# Patient Record
Sex: Male | Born: 1975 | Hispanic: Yes | Smoking: Never smoker
Health system: Southern US, Community
[De-identification: ages and names within clinical notes are randomized; demographics above are authoritative.]

## PROBLEM LIST (undated history)

## (undated) HISTORY — PX: OTHER SURGICAL HISTORY: SHX169

---

## 2015-06-27 ENCOUNTER — Encounter (HOSPITAL_COMMUNITY): Payer: Self-pay | Admitting: Emergency Medicine

## 2015-06-27 ENCOUNTER — Emergency Department (HOSPITAL_COMMUNITY)
Admission: EM | Admit: 2015-06-27 | Discharge: 2015-06-28 | Disposition: A | Discharge status: Home / Self Care | Payer: Self-pay | Attending: Emergency Medicine | Admitting: Emergency Medicine

## 2015-06-27 DIAGNOSIS — M791 Myalgia, unspecified site: Secondary | ICD-10-CM

## 2015-06-27 LAB — CBC WITH DIFFERENTIAL/PLATELET
Basophils Absolute: 0 10*3/uL (ref 0.0–0.1)
Basophils Relative: 0 % (ref 0–1)
EOS PCT: 2 % (ref 0–5)
Eosinophils Absolute: 0.1 10*3/uL (ref 0.0–0.7)
HCT: 44 % (ref 39.0–52.0)
Hemoglobin: 15 g/dL (ref 13.0–17.0)
Lymphocytes Relative: 45 % (ref 12–46)
Lymphs Abs: 2.6 10*3/uL (ref 0.7–4.0)
MCH: 31.5 pg (ref 26.0–34.0)
MCHC: 34.1 g/dL (ref 30.0–36.0)
MCV: 92.4 fL (ref 78.0–100.0)
Monocytes Absolute: 0.5 10*3/uL (ref 0.1–1.0)
Monocytes Relative: 8 % (ref 3–12)
Neutro Abs: 2.7 10*3/uL (ref 1.7–7.7)
Neutrophils Relative %: 45 % (ref 43–77)
PLATELETS: 247 10*3/uL (ref 150–400)
RBC: 4.76 MIL/uL (ref 4.22–5.81)
RDW: 13.1 % (ref 11.5–15.5)
WBC: 5.9 10*3/uL (ref 4.0–10.5)

## 2015-06-27 NOTE — ED Provider Notes (Signed)
CSN: [id number]     Arrival date & time 06/27/15  2259 History  This chart was scribed for  Darius Baton, MD by Bethel Born, ED Scribe. This patient was seen in room A03C/A03C and the patient's care was started at 11:59 PM.    Chief Complaint  Patient presents with  . Muscle Pain  . Chills    The history is provided by the patient and a friend. A language interpreter was used.   Darius Barrett is a 39 y.o. male who presents to the Emergency Department complaining of increasing myalgias with onset 5 months ago. The pain worsened last week and is primarily in the extremities and torso. He describes the pain as cramping. No manual labor or weight lifting. Associated symptoms include subjective fever 2 days ago, chills, headache, back pain that is worse with lifting, difficulty ambulating, generalized weakness and mild SOB. Pt denies cough, nausea,vomiting, and bowel or bladder incontinence.  He has been evaluated by a physician and was told that he may not be getting enough oxygen in his head. No other medical history. No daily medication. Pt uses alcohol occasionally and denies tobacco use. No PCP.   Pt speaks some English but the history is primarily translated from Spanish by a friend at the bedside.   History reviewed. No pertinent past medical history. Past Surgical History  Procedure Laterality Date  . Head surgery    . Arm surgery     No family history on file. History  Substance Use Topics  . Smoking status: Never Smoker   . Smokeless tobacco: Not on file  . Alcohol Use: Yes    Review of Systems  Constitutional: Positive for chills. Negative for fever.  Respiratory: Negative.  Negative for chest tightness and shortness of breath.   Cardiovascular: Negative.  Negative for chest pain.  Gastrointestinal: Negative.  Negative for abdominal pain.  Genitourinary: Negative.  Negative for dysuria.  Musculoskeletal: Positive for myalgias and back pain.  Skin: Negative for rash.   Neurological: Positive for weakness. Negative for headaches.  All other systems reviewed and are negative.     Allergies  Review of patient's allergies indicates no known allergies.  Home Medications   Prior to Admission medications   Not on File   BP 134/84 mmHg  Pulse 66  Temp(Src) 98.3 F (36.8 C) (Oral)  Resp 14  Ht 5\' 9"  (1.753 m)  Wt 146 lb (66.225 kg)  BMI 21.55 kg/m2  SpO2 96% Physical Exam  Constitutional: He is oriented to person, place, and time. No distress.  HENT:  Head: Normocephalic and atraumatic.  Mouth/Throat: Oropharynx is clear and moist.  Eyes: Pupils are equal, round, and reactive to light.  Neck: Neck supple.  Cardiovascular: Normal rate, regular rhythm and normal heart sounds.   No murmur heard. Pulmonary/Chest: Effort normal and breath sounds normal. No respiratory distress. He has no wheezes.  Abdominal: Soft. Bowel sounds are normal. There is no tenderness. There is no rebound and no guarding.  Musculoskeletal: He exhibits no edema.  Neurological: He is alert and oriented to person, place, and time.  4+ out of 5 strength all 4 extremities including grip, hip and knee flexion and extension, dorsi and plantar flexion  Skin: Skin is warm and dry.  Psychiatric: He has a normal mood and affect.  Nursing note and vitals reviewed.   ED Course  Procedures   DIAGNOSTIC STUDIES: Oxygen Saturation is 96% on RA, normal by my interpretation.    COORDINATION OF  CARE: 12:04 AM Discussed treatment plan which includes lab work and EKG with pt at bedside and pt agreed to plan.  1:49 AM I re-evaluated the patient and provided an update on the results of his lab work.  Labs Review Labs Reviewed  COMPREHENSIVE METABOLIC PANEL - Abnormal; Notable for the following:    Glucose, Bld 105 (*)    All other components within normal limits  CBC WITH DIFFERENTIAL/PLATELET  CK    Imaging Review Dg Chest Portable 1 View  06/28/2015   CLINICAL DATA:   Dyspnea, intermittent chills.  Onset last week.  EXAM: PORTABLE CHEST - 1 VIEW  COMPARISON:  None.  FINDINGS: A single AP portable view of the chest demonstrates no focal airspace consolidation or alveolar edema. The lungs are grossly clear. There is no large effusion or pneumothorax. Cardiac and mediastinal contours appear unremarkable.  IMPRESSION: No active disease.   Electronically Signed   By: Ellery Plunk M.D.   On: 06/28/2015 00:58     EKG Interpretation   Date/Time:  Friday June 27 2015 23:49:27 EDT Ventricular Rate:  63 PR Interval:  149 QRS Duration: 103 QT Interval:  414 QTC Calculation: 424 R Axis:   73 Text Interpretation:  Sinus rhythm Confirmed by Sal Spratley  MD, Rafik Koppel  ([id number]) on 06/28/2015 1:22:02 AM            MDM   Final diagnoses:  Myalgia    Patient presents with several months of muscle cramping and generalized weakness. Also reports chills and shortness of breath. No cough. No other infectious symptoms.  Nontoxic on exam. Vital signs are reassuring. EKG is normal. Chest x-ray shows no evidence of pneumonia. Basic labwork including CBC, CMP, and CK are reassuring. No evidence of electrolyte abnormalities or rhabdomyolysis.  On exam he was nonfocal. He did have 4+ out of 5 strength but that was symmetric and in all 4 extremities. This may be related to effort. Patient was able to ambulate independently and reports that he feels better after fluids. Discussed lab work and follow-up with the patient.  Given the chronic and ongoing nature of the patient's symptoms, do not need to feel he needs further workup at this time. He does need to establish primary care and was given referral.  After history, exam, and medical workup I feel the patient has been appropriately medically screened and is safe for discharge home. Pertinent diagnoses were discussed with the patient. Patient was given return precautions.  I personally performed the services described in this  documentation, which was scribed in my presence. The recorded information has been reviewed and is accurate.      Darius Baton, MD 06/28/15 0200

## 2015-06-27 NOTE — ED Notes (Signed)
Pt. reports intermittent chills and generalized body ( muscle) aches and cramps onset last week .

## 2015-06-28 ENCOUNTER — Emergency Department (HOSPITAL_COMMUNITY): Payer: Self-pay

## 2015-06-28 LAB — COMPREHENSIVE METABOLIC PANEL
ALBUMIN: 3.7 g/dL (ref 3.5–5.0)
ALT: 18 U/L (ref 17–63)
ANION GAP: 8 (ref 5–15)
AST: 22 U/L (ref 15–41)
Alkaline Phosphatase: 80 U/L (ref 38–126)
BUN: 14 mg/dL (ref 6–20)
CHLORIDE: 108 mmol/L (ref 101–111)
CO2: 25 mmol/L (ref 22–32)
Calcium: 9.1 mg/dL (ref 8.9–10.3)
Creatinine, Ser: 1.02 mg/dL (ref 0.61–1.24)
GFR calc non Af Amer: >60 mL/min (ref >60)
Glucose, Bld: 105 mg/dL — ABNORMAL HIGH (ref 65–99)
Potassium: 3.7 mmol/L (ref 3.5–5.1)
Sodium: 141 mmol/L (ref 135–145)
TOTAL PROTEIN: 6.6 g/dL (ref 6.5–8.1)
Total Bilirubin: 0.7 mg/dL (ref 0.3–1.2)

## 2015-06-28 LAB — CK: Total CK: 176 U/L (ref 49–397)

## 2015-06-28 MED ORDER — SODIUM CHLORIDE 0.9 % IV BOLUS (SEPSIS)
1000.0000 mL | Freq: Once | INTRAVENOUS | Status: AC
  Administered 2015-06-28: 1000 mL via INTRAVENOUS

## 2015-06-28 NOTE — ED Notes (Signed)
Dr Horton in room 

## 2015-06-28 NOTE — Discharge Instructions (Signed)
You were seen today for muscle pain. This is been ongoing for several months. Because of your pain and symptoms is unknown at this time. You improved with hydration. You need to establish primary care and maintain good hydration. If you develop unilateral weakness, worsening symptoms, difficulty ambulating you should be reevaluated.  Dolor muscular (Muscle Pain) Las causas del dolor muscular (mialgia) pueden ser [place], entre ellas:  Uso excesivo del msculo o distensin muscular, en especial si la persona no est en buen estado fsico. Esta es la causa ms comn del dolor muscular.  Lesiones.  [place].  Virus, como el de la gripe.  Enfermedades infecciosas.  Fibromialgia, que es una afeccin crnica que causa dolor de [place], fatiga y dolor muscular con la palpacin.  Enfermedades autoinmunes, como el lupus.  Determinados medicamentos, como los inhibidores de la ECA y las estatinas. El dolor muscular puede ser leve o intenso. La mayora de las veces, el dolor dura solo un corto perodo y desaparece sin tratamiento. Para diagnosticar la causa del dolor muscular, el mdico le preguntar los antecedentes mdicos. Esto significa que le preguntar cundo comenz Psychiatric nurse y qu ha sucedido desde ese momento. Si el dolor muscular no comenz hace mucho tiempo, el mdico puede esperar antes de hacer diversas pruebas. Si el dolor muscular comenz hace mucho tiempo, el mdico puede decidir que es ms conveniente hacer pruebas de inmediato. Si el mdico cree que Psychiatric nurse puede estar causado por una enfermedad, es posible que necesite hacer pruebas adicionales para descartar determinadas afecciones.  El tratamiento del dolor muscular depende de la causa. El cuidado en el hogar a menudo es suficiente para Photographer. El mdico tambin puede recetarle un medicamento antinflamatorio. INSTRUCCIONES PARA EL CUIDADO EN EL HOGAR Controle su afeccin para ver si hay cambios.  Las siguientes indicaciones ayudarn a Architectural technologist que pueda sentir:  [employer] medicamentos de venta libre o recetados solamente segn las indicaciones del mdico.  Aplique hielo en el msculo dolorido:  Ponga el hielo en una bolsa plstica.  Colquese una toalla entre la piel y la bolsa de hielo.  Aplique el hielo de 3 a 4 veces por da durante 15 a .  Puede alternar la colocacin de compresas calientes y fras en el msculo segn lo que le haya indicado el mdico.  Si el uso excesivo del msculo le est causando dolor muscular, disminuya las actividades hasta que el dolor desaparezca.  Recuerde que es normal sentir algo de dolor muscular despus de comenzar un programa de entrenamiento. Generalmente duelen aquellos msculos que no se utilizan con frecuencia.  Si usted no hace actividad fsica con frecuencia, los ejercicios deben ser suaves y regulares.  Para reducir el riesgo de dolor muscular, haga un precalentamiento antes de la actividad fsica.  No siga haciendo actividad fsica si el dolor es muy intenso. Este tipo de dolor podra indicar que se ha lesionado un msculo. SOLICITE ATENCIN MDICA SI:  El Sport and exercise psychologist empeora y los medicamentos no surten [place].  Tiene dolor muscular que dura ms de 3das.  Tiene una erupcin cutnea o fiebre junto con el dolor muscular.  Tiene dolor muscular despus de una picadura de garrapata.  Tiene dolor muscular mientras hace actividad fsica, aunque est en buen estado fsico.  Tiene enrojecimiento, sensibilidad o hinchazn junto con el dolor muscular.  Tiene dolor muscular despus de comenzar un medicamento nuevo o de cambiar la dosis de un medicamento. SOLICITE ATENCIN MDICA DE INMEDIATO SI:  Tiene dificultad para respirar.  Presenta dificultad para tragar.  Tiene dolor muscular junto con rigidez en el cuello, fiebre y vmitos.  Tiene debilidad muscular intensa o no puede mover una parte del  cuerpo. ASEGRESE DE QUE:   Comprende estas instrucciones.  Controlar su afeccin.  Recibir ayuda de inmediato si no mejora o si empeora. Document Released: 02/22/2008 Document Revised: 11/20/2013 [facility] Patient Information 2015 [place], [place]. This information is not intended to replace advice given to you by your health care provider. Make sure you discuss any questions you have with your health care provider.

## 2015-06-28 NOTE — ED Notes (Signed)
Pt now states that he has been having cramps for 5 months.

## 2015-06-28 NOTE — ED Notes (Signed)
Pt verbalized understanding of d/c instructions and has no further questions. Pt discharged home with friends.

## 2016-02-17 IMAGING — CR DG CHEST 1V PORT
1 series · 1 of 1 positions shown · non-contrast
Comparison: None.

CLINICAL DATA: Dyspnea, intermittent chills.  Onset last week.

EXAM:
PORTABLE CHEST - 1 VIEW

[AP]
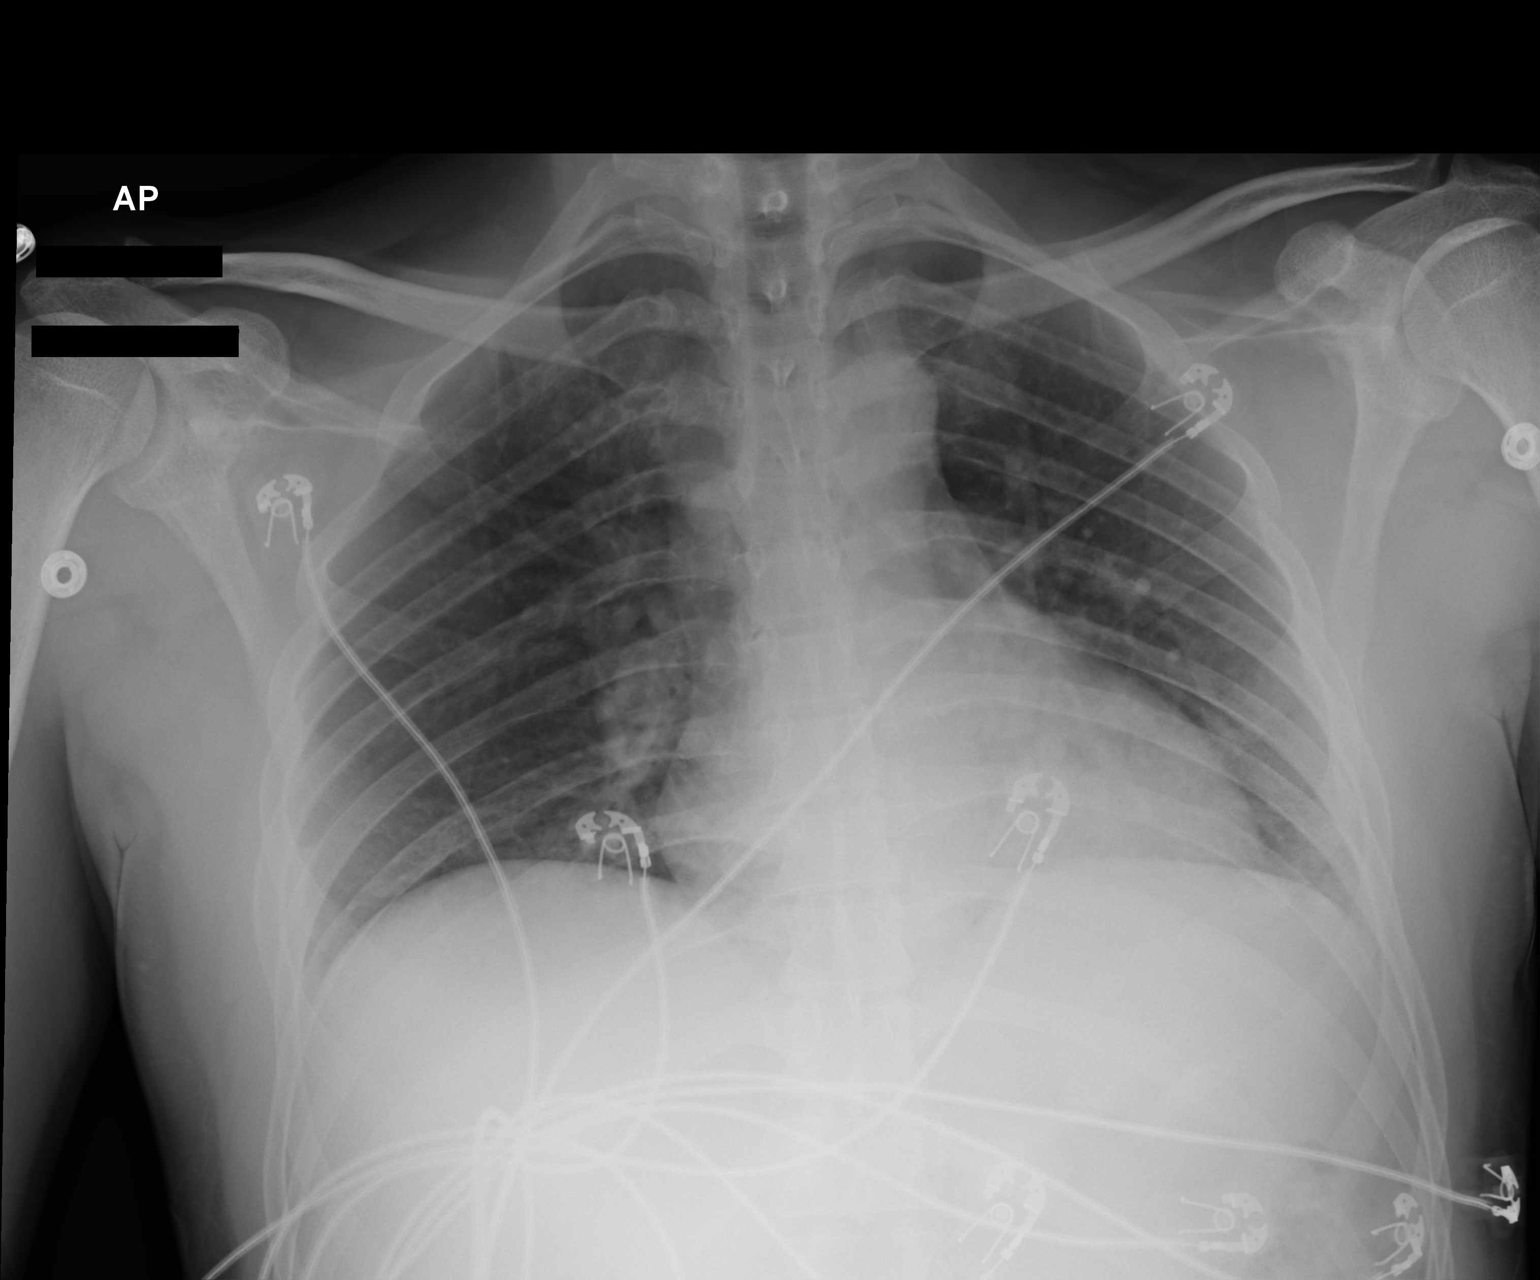

[1 of 1 positions shown; findings below may reference images not displayed]

FINDINGS: A single AP portable view of the chest demonstrates no focal
airspace consolidation or alveolar edema. The lungs are grossly
clear. There is no large effusion or pneumothorax. Cardiac and
mediastinal contours appear unremarkable.
IMPRESSION: No active disease.
